# Patient Record
Sex: Male | Born: 1940 | Hispanic: No | Marital: Married | Smoking: Former smoker
Health system: Southern US, Community
[De-identification: ages and names within clinical notes are randomized; demographics above are authoritative.]

## PROBLEM LIST (undated history)

## (undated) DIAGNOSIS — R011 Cardiac murmur, unspecified: Secondary | ICD-10-CM

## (undated) DIAGNOSIS — I350 Nonrheumatic aortic (valve) stenosis: Secondary | ICD-10-CM

## (undated) HISTORY — DX: Cardiac murmur, unspecified: R01.1

## (undated) HISTORY — PX: CATARACT EXTRACTION: SUR2

## (undated) HISTORY — DX: Nonrheumatic aortic (valve) stenosis: I35.0

---

## 2012-06-06 ENCOUNTER — Institutional Professional Consult (permissible substitution): Payer: Self-pay | Admitting: Cardiovascular Disease

## 2012-06-26 ENCOUNTER — Ambulatory Visit (INDEPENDENT_AMBULATORY_CARE_PROVIDER_SITE_OTHER): Payer: Medicare Other | Admitting: Cardiovascular Disease

## 2012-06-26 ENCOUNTER — Encounter: Payer: Self-pay | Admitting: Cardiovascular Disease

## 2012-06-26 VITALS — BP 137/86 | HR 59 | Ht 69.0 in | Wt 189.0 lb

## 2012-06-26 DIAGNOSIS — I359 Nonrheumatic aortic valve disorder, unspecified: Secondary | ICD-10-CM

## 2012-06-26 DIAGNOSIS — R011 Cardiac murmur, unspecified: Secondary | ICD-10-CM

## 2012-06-26 DIAGNOSIS — I350 Nonrheumatic aortic (valve) stenosis: Secondary | ICD-10-CM

## 2012-06-26 NOTE — Progress Notes (Signed)
Patient ID: Bill [name], male   DOB: September 18, 1941, 71 y.o.   MRN: [id number] Healthy 71 yo referred by [facility] medical for murmur.  Denies previous history.  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  No chest pain dyspnea palpitatoins, fever or syncope.  No history of rheumatic fever.  Good dentition.  No previous echo.  Originally from [place].  Been here 13 years.  Only surgery he has had is cataract surgery.    ROS: Denies fever, malais, weight loss, blurry vision, decreased visual acuity, cough, sputum, SOB, hemoptysis, pleuritic pain, palpitaitons, heartburn, abdominal pain, melena, lower extremity edema, claudication, or rash.  All other systems reviewed and negative   General: Affect appropriate Healthy:  appears stated age HEENT: normal Neck supple with no adenopathy JVP normal no bruits no thyromegaly Lungs clear with no wheezing and good diaphragmatic motion Heart:  S1/S2 midl AS murmur, no rub, gallop or click PMI normal Abdomen: benighn, BS positve, no tenderness, no AAA no bruit.  No HSM or HJR Distal pulses intact with no bruits No edema Neuro non-focal Skin warm and dry No muscular weakness  Medications Current Outpatient Prescriptions  Medication Sig Dispense Refill  . aspirin 81 MG tablet Take 81 mg by mouth every other day.      Marland Kitchen CALCIUM PO Take by mouth daily.      . fish oil-omega-3 fatty acids 1000 MG capsule Take 2 g by mouth every other day.      . Multiple Vitamins-Minerals (CENTRUM SILVER PO) Take by mouth every other day.      . b complex vitamins tablet Take 1 tablet by mouth every other day.         Allergies Review of patient's allergies indicates not on file.  Family History: Family History  Problem Relation Age of Onset  . Heart failure Father   . Hypertension      family history    Social History: History   Social History  . Marital Status: Married    Spouse Name: N/A    Number of Children: N/A  . Years of Education: N/A   Occupational History    . full time      [employer]   Social History Main Topics  . Smoking status: Former Games developer  . Smokeless tobacco: Not on file  . Alcohol Use: Yes     occasionally  . Drug Use: No  . Sexually Active: Not on file   Other Topics Concern  . Not on file   Social History Narrative  . No narrative on file    Electrocardiogram:  NSR rate 59  Possible LAE otherwise normal  Assessment and Plan

## 2012-06-26 NOTE — Patient Instructions (Signed)
Your physician recommends that you schedule a follow-up appointment in: YEAR WITH DR [facility] Your physician recommends that you continue on your current medications as directed. Please refer to the Current Medication list given to you today. Your physician has requested that you have an echocardiogram. Echocardiography is a painless test that uses sound waves to create images of your heart. It provides your doctor with information about the size and shape of your heart and how well your heart's chambers and valves are working. This procedure takes approximately one hour. There are no restrictions for this procedure. DX AS MURMUR

## 2012-06-26 NOTE — Assessment & Plan Note (Signed)
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  No need for SBE  Echo and F/U in a year

## 2012-06-27 ENCOUNTER — Ambulatory Visit (HOSPITAL_COMMUNITY): Payer: Medicare Other | Attending: Cardiology | Admitting: Radiology

## 2012-06-27 DIAGNOSIS — I350 Nonrheumatic aortic (valve) stenosis: Secondary | ICD-10-CM

## 2012-06-27 DIAGNOSIS — R011 Cardiac murmur, unspecified: Secondary | ICD-10-CM

## 2012-06-27 DIAGNOSIS — I359 Nonrheumatic aortic valve disorder, unspecified: Secondary | ICD-10-CM | POA: Insufficient documentation

## 2012-06-27 NOTE — Progress Notes (Signed)
Echocardiogram performed.  

## 2012-07-01 NOTE — Addendum Note (Signed)
Addended by: Micki Riley C on: 07/01/2012 01:47 PM   Modules accepted: Orders

## 2013-07-01 ENCOUNTER — Ambulatory Visit: Payer: Medicare Other | Admitting: Cardiovascular Disease

## 2013-08-06 ENCOUNTER — Encounter: Payer: Self-pay | Admitting: Cardiovascular Disease

## 2013-08-06 ENCOUNTER — Encounter: Payer: Self-pay | Admitting: *Deleted

## 2013-08-06 DIAGNOSIS — I350 Nonrheumatic aortic (valve) stenosis: Secondary | ICD-10-CM | POA: Insufficient documentation

## 2013-08-07 ENCOUNTER — Encounter: Payer: Self-pay | Admitting: Cardiovascular Disease

## 2013-08-07 ENCOUNTER — Ambulatory Visit (INDEPENDENT_AMBULATORY_CARE_PROVIDER_SITE_OTHER): Payer: Medicare Other | Admitting: Cardiovascular Disease

## 2013-08-07 VITALS — BP 120/82 | HR 68 | Wt 189.0 lb

## 2013-08-07 DIAGNOSIS — E119 Type 2 diabetes mellitus without complications: Secondary | ICD-10-CM

## 2013-08-07 DIAGNOSIS — I350 Nonrheumatic aortic (valve) stenosis: Secondary | ICD-10-CM

## 2013-08-07 DIAGNOSIS — I359 Nonrheumatic aortic valve disorder, unspecified: Secondary | ICD-10-CM

## 2013-08-07 DIAGNOSIS — I10 Essential (primary) hypertension: Secondary | ICD-10-CM

## 2013-08-07 NOTE — Assessment & Plan Note (Signed)
No symptoms  F/U echo for gradients and LV function

## 2013-08-07 NOTE — Assessment & Plan Note (Signed)
Discussed low carb diet.  Target hemoglobin A1c is 6.5 or less.  Continue current medications.  

## 2013-08-07 NOTE — Patient Instructions (Addendum)
Your physician wants you to follow-up in:   YEAR WITH DR Eden Emms ou will receive a reminder letter in the mail two months in advance. If you don't receive a letter, please call our office to schedule the follow-up appointment. Your physician recommends that you continue on your current medications as directed. Please refer to the Current Medication list given to you today. Your physician has requested that you have an echocardiogram. Echocardiography is a painless test that uses sound waves to create images of your heart. It provides your doctor with information about the size and shape of your heart and how well your heart's chambers and valves are working. This procedure takes approximately one hour. There are no restrictions for this procedure.

## 2013-08-07 NOTE — Assessment & Plan Note (Signed)
Well controlled.  Continue current medications and low sodium Dash type diet.   Continue ACE given DM

## 2013-08-07 NOTE — Progress Notes (Signed)
Patient ID: Bill [name], male   DOB: [birth year], 72 y.o.   MRN: [id number] Healthy 72 yo referred by [facility] medical for murmur. Denies previous history. 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 No chest pain dyspnea palpitatoins, fever or syncope. No history of rheumatic fever. Good dentition. No previous echo. Originally from [place]. Been here 13 years. Only surgery he has had is cataract surgery.   Since I last saw him being Rx for diabetes and HTN.  He loves to eat ice cream which is part of the problem  Echo done  06/27/12  Study Conclusions  - Left ventricle: The cavity size was normal. Wall thickness was increased in a pattern of mild LVH. Systolic function was normal. The estimated ejection fraction was in the range of 60% to 65%. Wall motion was normal; there were no regional wall motion abnormalities. Doppler parameters are consistent with abnormal left ventricular relaxation (grade 1 diastolic dysfunction). - Aortic valve: Valve mobility was restricted. There was mild stenosis. Mild regurgitation. Mean gradient: 14mm Hg (S). Peak gradient: 23mm Hg (S).  ROS: Denies fever, malais, weight loss, blurry vision, decreased visual acuity, cough, sputum, SOB, hemoptysis, pleuritic pain, palpitaitons, heartburn, abdominal pain, melena, lower extremity edema, claudication, or rash.  All other systems reviewed and negative  General: Affect appropriate Healthy:  appears stated age HEENT: normal Neck supple with no adenopathy JVP normal no bruits no thyromegaly Lungs clear with no wheezing and good diaphragmatic motion Heart:  S1/S2 preserved mild AS  murmur, no rub, gallop or click PMI normal Abdomen: benighn, BS positve, no tenderness, no AAA no bruit.  No HSM or HJR Distal pulses intact with no bruits No edema Neuro non-focal Skin warm and dry No muscular weakness   Current Outpatient Prescriptions  Medication Sig Dispense Refill  . aspirin 81 MG tablet Take 81 mg by mouth every other  day.      . b complex vitamins tablet Take 1 tablet by mouth every other day.       Marland Kitchen CALCIUM PO Take by mouth daily.      . fish oil-omega-3 fatty acids 1000 MG capsule Take 2 g by mouth every other day.      Marland Kitchen glimepiride (AMARYL) 1 MG tablet Take 1 tablet by mouth daily.      Marland Kitchen lisinopril (PRINIVIL,ZESTRIL) 5 MG tablet Take 1 tablet by mouth daily.      . Multiple Vitamins-Minerals (CENTRUM SILVER PO) Take by mouth every other day.      . triamcinolone cream (KENALOG) 0.1 % as directed.       No current facility-administered medications for this visit.    Allergies  Review of patient's allergies indicates not on file.  Electrocardiogram: SR rate 68 normal   Assessment and Plan

## 2013-08-21 ENCOUNTER — Other Ambulatory Visit (HOSPITAL_COMMUNITY): Payer: Self-pay | Admitting: Cardiovascular Disease

## 2013-08-21 ENCOUNTER — Ambulatory Visit (HOSPITAL_COMMUNITY): Payer: Medicare Other | Attending: Cardiology | Admitting: Radiology

## 2013-08-21 DIAGNOSIS — R011 Cardiac murmur, unspecified: Secondary | ICD-10-CM | POA: Insufficient documentation

## 2013-08-21 DIAGNOSIS — I10 Essential (primary) hypertension: Secondary | ICD-10-CM | POA: Insufficient documentation

## 2013-08-21 DIAGNOSIS — I359 Nonrheumatic aortic valve disorder, unspecified: Secondary | ICD-10-CM | POA: Insufficient documentation

## 2013-08-21 DIAGNOSIS — I350 Nonrheumatic aortic (valve) stenosis: Secondary | ICD-10-CM

## 2013-08-21 DIAGNOSIS — E119 Type 2 diabetes mellitus without complications: Secondary | ICD-10-CM | POA: Insufficient documentation

## 2013-08-21 NOTE — Progress Notes (Signed)
Echocardiogram performed.  

## 2013-08-29 ENCOUNTER — Telehealth: Payer: Self-pay | Admitting: Cardiovascular Disease

## 2013-08-29 NOTE — Telephone Encounter (Signed)
F/U   Pt calling back to speak with nurse.

## 2013-08-29 NOTE — Telephone Encounter (Signed)
LMTCB ./CY 

## 2013-08-29 NOTE — Telephone Encounter (Signed)
Follow up    Pt returned your call with results.

## 2013-09-01 NOTE — Telephone Encounter (Signed)
Pt aware of echo results ./cy 

## 2014-08-25 ENCOUNTER — Other Ambulatory Visit: Payer: Self-pay | Admitting: *Deleted

## 2014-08-25 ENCOUNTER — Ambulatory Visit (INDEPENDENT_AMBULATORY_CARE_PROVIDER_SITE_OTHER): Payer: Medicare Other | Admitting: Cardiovascular Disease

## 2014-08-25 VITALS — BP 130/84 | HR 54 | Ht 69.0 in | Wt 193.0 lb

## 2014-08-25 DIAGNOSIS — I10 Essential (primary) hypertension: Secondary | ICD-10-CM

## 2014-08-25 DIAGNOSIS — I350 Nonrheumatic aortic (valve) stenosis: Secondary | ICD-10-CM

## 2014-08-25 DIAGNOSIS — E119 Type 2 diabetes mellitus without complications: Secondary | ICD-10-CM

## 2014-08-25 NOTE — Assessment & Plan Note (Signed)
No change in murmur active with no symptoms echo in a year

## 2014-08-25 NOTE — Progress Notes (Signed)
Patient ID: Bill [name], male   DOB: 1940/12/18, 73 y.o.   MRN: [id number] Healthy 73 yo referred by [facility] for murmur. Denies previous history. Acitve running two [employer]. No chest pain dyspnea palpitatoins, fever or syncope. No history of rheumatic fever. Good dentition. No previous echo. Originally from [place]. Been here 13 years. Only surgery he has had is cataract surgery. Since I last saw him being Rx for diabetes and HTN. He loves to eat ice cream which is part of the problem  Echo done 10/14   Study Conclusions  - Left ventricle: The cavity size was normal. There was mild concentric hypertrophy. Systolic function was normal. The estimated ejection fraction was in the range of 60% to 65%. Wall motion was normal; there were no regional wall motion abnormalities. Doppler parameters are consistent with abnormal left ventricular relaxation (grade 1 diastolic dysfunction). - Aortic valve: Possibly bicuspid; severely thickened, moderately calcified leaflets. Valve mobility was restricted. There was moderate stenosis. Mild regurgitation. - Atrial septum: No defect or patent foramen ovale was identified.  Mean gradient 16 peak 29 mmHg  Exercising a lot with no dyspnea, chest pain or palpitations. Has two grand children in [place] that he enjoys a lot especially 73 yo girl No longer on BP meds per primary Dr [name] on [address]    ROS: Denies fever, malais, weight loss, blurry vision, decreased visual acuity, cough, sputum, SOB, hemoptysis, pleuritic pain, palpitaitons, heartburn, abdominal pain, melena, lower extremity edema, claudication, or rash.  All other systems reviewed and negative  General: Affect appropriate Healthy:  appears stated age HEENT: normal Neck supple with no adenopathy JVP normal no bruits no thyromegaly Lungs clear with no wheezing and good diaphragmatic motion Heart:  S1/S2 AS  murmur, no rub, gallop or click PMI normal Abdomen: benighn, BS positve,  no tenderness, no AAA no bruit.  No HSM or HJR Distal pulses intact with no bruits No edema Neuro non-focal Skin warm and dry No muscular weakness   Current Outpatient Prescriptions  Medication Sig Dispense Refill  . aspirin 81 MG tablet Take 81 mg by mouth every other day.      . b complex vitamins tablet Take 1 tablet by mouth every other day.       Marland Kitchen. CALCIUM PO Take by mouth daily.      . fish oil-omega-3 fatty acids 1000 MG capsule Take 2 g by mouth every other day.      Marland Kitchen. glimepiride (AMARYL) 1 MG tablet Take 1 tablet by mouth daily.      Marland Kitchen. lisinopril (PRINIVIL,ZESTRIL) 5 MG tablet Take 1 tablet by mouth daily.      . Multiple Vitamins-Minerals (CENTRUM SILVER PO) Take by mouth every other day.      . triamcinolone cream (KENALOG) 0.1 % as directed.       No current facility-administered medications for this visit.    Allergies  Review of patient's allergies indicates not on file.  Electrocardiogram: 9/14 SR rate 68 normal  Today SR rate 58 normal   Assessment and Plan

## 2014-08-25 NOTE — Assessment & Plan Note (Signed)
Discussed low carb diet.  Target hemoglobin A1c is 6.5 or less.  Continue current medications.  

## 2014-08-25 NOTE — Patient Instructions (Signed)
Your physician wants you to follow-up in: YEAR WITH DR NISHAN  ECHO SAME  DAY   You will receive a reminder letter in the mail two months in advance. If you don't receive a letter, please call our office to schedule the follow-up appointment. Your physician recommends that you continue on your current medications as directed. Please refer to the Current Medication list given to you today. 

## 2014-08-25 NOTE — Assessment & Plan Note (Signed)
Well controlled.  Continue current medications and low sodium Dash type diet.    

## 2015-09-28 ENCOUNTER — Other Ambulatory Visit: Payer: Self-pay | Admitting: Cardiovascular Disease

## 2015-09-28 DIAGNOSIS — I350 Nonrheumatic aortic (valve) stenosis: Secondary | ICD-10-CM

## 2015-09-28 NOTE — Progress Notes (Signed)
Patient ID: Bill [name], male   DOB: [birth year], 74 y.o.   MRN: [id number] Healthy 74 y.o.  [facility] for murmur. Denies previous history. Acitve running two [employer] Sub[employer]. No chest pain dyspnea palpitatoins, fever or syncope. No history of rheumatic fever. Good dentition. No previous echo. Originally from [place]. Been here 13 years. Only surgery he has had is cataract surgery. Since I last saw him being Rx for diabetes and HTN. He loves to eat ice cream which is part of the problem  Echo done 10/14   Study Conclusions  - Left ventricle: The cavity size was normal. There was mild concentric hypertrophy. Systolic function was normal. The estimated ejection fraction was in the range of 60% to 65%. Wall motion was normal; there were no regional wall motion abnormalities. Doppler parameters are consistent with abnormal left ventricular relaxation (grade 1 diastolic dysfunction). - Aortic valve: Possibly bicuspid; severely thickened, moderately calcified leaflets. Valve mobility was restricted. There was moderate stenosis. Mild regurgitation. - Atrial septum: No defect or patent foramen ovale was identified.  Mean gradient 16 peak 29 mmHg  Exercising a lot with no dyspnea, chest pain or palpitations. Has two grand children in [place] that he enjoys a lot especially 74 yo girl No longer on BP meds per primary Dr [name] on [address]    ROS: Denies fever, malais, weight loss, blurry vision, decreased visual acuity, cough, sputum, SOB, hemoptysis, pleuritic pain, palpitaitons, heartburn, abdominal pain, melena, lower extremity edema, claudication, or rash.  All other systems reviewed and negative  General: Affect appropriate Healthy:  appears stated age HEENT: normal Neck supple with no adenopathy JVP normal no bruits no thyromegaly Lungs clear with no wheezing and good diaphragmatic motion Heart:  S1/S2 AS  murmur, no rub, gallop or click PMI normal Abdomen: benighn, BS  positve, no tenderness, no AAA no bruit.  No HSM or HJR Distal pulses intact with no bruits No edema Neuro non-focal Skin warm and dry No muscular weakness   Current Outpatient Prescriptions  Medication Sig Dispense Refill  . aspirin 81 MG tablet Take 81 mg by mouth every other day.    . b complex vitamins tablet Take 1 tablet by mouth every other day.     Marland Kitchen. CALCIUM PO Take by mouth daily.    . fish oil-omega-3 fatty acids 1000 MG capsule Take 2 g by mouth every other day.    Marland Kitchen. glimepiride (AMARYL) 1 MG tablet Take 1 tablet by mouth daily.    . Multiple Vitamins-Minerals (CENTRUM SILVER PO) Take by mouth every other day.    . triamcinolone cream (KENALOG) 0.1 % as directed.     No current facility-administered medications for this visit.    Allergies  Review of patient's allergies indicates no known allergies.  Electrocardiogram: 9/14 SR rate 68 normal  Oct 2015 58 normal   09/29/15  NSR rate 61 normal ECG   Assessment and Plan Aortic Stenosis: Reviewed echo from today  Moderate LVH mild AS mean gradient 10 mmHg f/u echo in a year no need for SBE HTN: Well controlled.  Continue current medications and low sodium Dash type diet.   DM: Discussed low carb diet.  Target hemoglobin A1c is 6.5 or less.  Continue current medications.     Bill [name]

## 2015-09-29 ENCOUNTER — Ambulatory Visit (INDEPENDENT_AMBULATORY_CARE_PROVIDER_SITE_OTHER): Payer: Medicare Other | Admitting: Cardiovascular Disease

## 2015-09-29 ENCOUNTER — Other Ambulatory Visit: Payer: Self-pay

## 2015-09-29 ENCOUNTER — Encounter: Payer: Self-pay | Admitting: Cardiovascular Disease

## 2015-09-29 ENCOUNTER — Ambulatory Visit (HOSPITAL_COMMUNITY): Payer: Medicare Other | Attending: Cardiology

## 2015-09-29 VITALS — BP 120/76 | HR 61 | Ht 67.0 in | Wt 189.0 lb

## 2015-09-29 DIAGNOSIS — I10 Essential (primary) hypertension: Secondary | ICD-10-CM | POA: Insufficient documentation

## 2015-09-29 DIAGNOSIS — I517 Cardiomegaly: Secondary | ICD-10-CM | POA: Diagnosis not present

## 2015-09-29 DIAGNOSIS — I350 Nonrheumatic aortic (valve) stenosis: Secondary | ICD-10-CM

## 2015-09-29 DIAGNOSIS — I352 Nonrheumatic aortic (valve) stenosis with insufficiency: Secondary | ICD-10-CM | POA: Diagnosis not present

## 2015-09-29 DIAGNOSIS — E119 Type 2 diabetes mellitus without complications: Secondary | ICD-10-CM | POA: Diagnosis not present

## 2015-09-29 DIAGNOSIS — Z87891 Personal history of nicotine dependence: Secondary | ICD-10-CM | POA: Insufficient documentation

## 2015-09-29 NOTE — Patient Instructions (Addendum)
Medication Instructions:  Your physician recommends that you continue on your current medications as directed. Please refer to the Current Medication list given to you today.  Labwork: NONE  Testing/Procedures: Your physician has requested that you have an echocardiogram in 1 year. Echocardiography is a painless test that uses sound waves to create images of your heart. It provides your doctor with information about the size and shape of your heart and how well your heart's chambers and valves are working. This procedure takes approximately one hour. There are no restrictions for this procedure.  Follow-Up: Your physician wants you to follow-up in: 1 year with Dr. Eden EmmsNishan. You will receive a reminder letter in the mail two months in advance. If you don't receive a letter, please call our office to schedule the follow-up appointment.  Please give us a call when you get home to inform our office of your current medications.  Any Other Special Instructions Will Be Listed Below (If Applicable).     If you need a refill on your cardiac medications before your next appointment, please call your pharmacy.

## 2016-09-20 ENCOUNTER — Other Ambulatory Visit (HOSPITAL_COMMUNITY): Payer: Medicare Other

## 2016-10-18 ENCOUNTER — Other Ambulatory Visit: Payer: Self-pay

## 2016-10-18 ENCOUNTER — Ambulatory Visit (HOSPITAL_COMMUNITY): Payer: Medicare Other | Attending: Cardiology

## 2016-10-18 DIAGNOSIS — I350 Nonrheumatic aortic (valve) stenosis: Secondary | ICD-10-CM

## 2016-10-18 DIAGNOSIS — Q231 Congenital insufficiency of aortic valve: Secondary | ICD-10-CM | POA: Insufficient documentation

## 2016-10-18 DIAGNOSIS — I10 Essential (primary) hypertension: Secondary | ICD-10-CM | POA: Insufficient documentation

## 2016-10-20 ENCOUNTER — Telehealth: Payer: Self-pay

## 2016-10-20 NOTE — Telephone Encounter (Signed)
Called pt, no answer.  Will try again Monday

## 2016-10-24 ENCOUNTER — Telehealth: Payer: Self-pay | Admitting: *Deleted

## 2016-10-24 NOTE — Telephone Encounter (Signed)
Called patient with test results. No answer. Left message to call back.  

## 2016-10-24 NOTE — Telephone Encounter (Signed)
-----   Message from Wendall StadePeter C Nishan, MD sent at 10/18/2016  2:30 PM EST ----- Moderate AS stable aorta dilated at 4.2 cm needs CTA of chest to look at entire aorta in setting of bicuspid valve And r/o aneurysm

## 2016-10-27 ENCOUNTER — Ambulatory Visit: Payer: Medicare Other

## 2016-10-27 ENCOUNTER — Other Ambulatory Visit: Payer: Self-pay

## 2016-10-27 DIAGNOSIS — Z136 Encounter for screening for cardiovascular disorders: Secondary | ICD-10-CM

## 2016-11-01 ENCOUNTER — Other Ambulatory Visit: Payer: Self-pay

## 2016-11-01 ENCOUNTER — Other Ambulatory Visit: Payer: Medicare Other | Admitting: *Deleted

## 2016-11-01 DIAGNOSIS — Z01812 Encounter for preprocedural laboratory examination: Secondary | ICD-10-CM

## 2016-11-01 LAB — BASIC METABOLIC PANEL
BUN: 16 mg/dL (ref 7–25)
CHLORIDE: 100 mmol/L (ref 98–110)
CO2: 27 mmol/L (ref 20–31)
Calcium: 9.1 mg/dL (ref 8.6–10.3)
Creat: 1.3 mg/dL — ABNORMAL HIGH (ref 0.70–1.18)
GLUCOSE: 212 mg/dL — AB (ref 65–99)
POTASSIUM: 4.6 mmol/L (ref 3.5–5.3)
Sodium: 135 mmol/L (ref 135–146)

## 2016-11-02 ENCOUNTER — Ambulatory Visit (INDEPENDENT_AMBULATORY_CARE_PROVIDER_SITE_OTHER)
Admission: RE | Admit: 2016-11-02 | Discharge: 2016-11-02 | Disposition: A | Discharge status: Home / Self Care | Payer: Medicare Other | Source: Ambulatory Visit | Attending: Cardiovascular Disease | Admitting: Cardiovascular Disease

## 2016-11-02 DIAGNOSIS — Z136 Encounter for screening for cardiovascular disorders: Secondary | ICD-10-CM

## 2016-11-02 MED ORDER — IOPAMIDOL (ISOVUE-370) INJECTION 76%
100.0000 mL | Freq: Once | INTRAVENOUS | Status: AC | PRN
  Administered 2016-11-02: 100 mL via INTRAVENOUS

## 2016-11-07 ENCOUNTER — Telehealth: Payer: Self-pay | Admitting: Cardiovascular Disease

## 2016-11-07 NOTE — Telephone Encounter (Signed)
Follow Up:    Pt would like his CT results from 11-02-16. He says he would like a copy of it also please.

## 2016-11-07 NOTE — Telephone Encounter (Signed)
Read to pt results of CT,copied pcp,mailed results to pt

## 2017-02-20 NOTE — Progress Notes (Signed)
Patient ID: Bill [name], male   DOB: Jun 30, 1941, 76 y.o.   MRN: [id number] Healthy 76 y.o. initially  referred by [facility] 2014  for murmur.  Diagnosed with aortic stenosis Denies previous history. 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 No chest pain dyspnea palpitatoins, fever or syncope. No history of rheumatic fever. Good dentition.  Originally from [place]. Been here 15 years. Only surgery he has had is cataract surgery.  Since I last saw him being Rx for diabetes and HTN. He loves to eat ice cream which is part of the problem  Echo done 10/14 with normal EF moderate AS mild AR mean gradient 16 mmHg peak 29 mmHg Echo done 10/18/16 with normal EF moderate AS mean gradient 17 mmHg and peak 34 mmHg  CTA: 11/02/16 personally reviewed:  Ascending aortic root 4.1 cm    Enjoys seeing grandchildren in [place] 70 yo girl very talented at gymnastics    ROS: Denies fever, malais, weight loss, blurry vision, decreased visual acuity, cough, sputum, SOB, hemoptysis, pleuritic pain, palpitaitons, heartburn, abdominal pain, melena, lower extremity edema, claudication, or rash.  All other systems reviewed and negative  General: Affect appropriate Healthy:  appears stated age HEENT: normal Neck supple with no adenopathy JVP normal no bruits no thyromegaly Lungs clear with no wheezing and good diaphragmatic motion Heart:  S1/S2 AS  murmur, no rub, gallop or click PMI normal Abdomen: benighn, BS positve, no tenderness, no AAA no bruit.  No HSM or HJR Distal pulses intact with no bruits No edema Neuro non-focal Skin warm and dry No muscular weakness   Current Outpatient Prescriptions  Medication Sig Dispense Refill  . glimepiride (AMARYL) 1 MG tablet Take 1 tablet by mouth daily as needed (Take as directed for low blood sugar).     . JENTADUETO 2.03-999 MG TABS Take 1 tablet by mouth daily.    Marland Kitchen lisinopril (PRINIVIL,ZESTRIL) 5 MG tablet Take 1 tablet by mouth daily.    . pravastatin (PRAVACHOL) 10 MG tablet  Take 1 tablet by mouth daily.     No current facility-administered medications for this visit.     Allergies  Patient has no known allergies.  Electrocardiogram: 9/14 SR rate 68 normal  Oct 2015 58 normal   09/29/15  NSR rate 61 normal ECG 02/21/17  NSR rate 83 normal ECG   Assessment and Plan Aortic Stenosis: no change in murmur needs f/u echo  November of this year no SBE required  HTN: Well controlled.  Continue current medications and low sodium Dash type diet.   DM: Discussed low carb diet.  Target hemoglobin A1c is 6.5 or less.  Continue current medications. 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

## 2017-02-21 ENCOUNTER — Ambulatory Visit (INDEPENDENT_AMBULATORY_CARE_PROVIDER_SITE_OTHER): Payer: Medicare Other | Admitting: Cardiovascular Disease

## 2017-02-21 ENCOUNTER — Encounter (INDEPENDENT_AMBULATORY_CARE_PROVIDER_SITE_OTHER): Payer: Self-pay

## 2017-02-21 ENCOUNTER — Encounter: Payer: Self-pay | Admitting: Cardiovascular Disease

## 2017-02-21 VITALS — BP 126/80 | HR 82 | Ht 68.0 in | Wt 191.8 lb

## 2017-02-21 DIAGNOSIS — R011 Cardiac murmur, unspecified: Secondary | ICD-10-CM | POA: Diagnosis not present

## 2017-02-21 DIAGNOSIS — I350 Nonrheumatic aortic (valve) stenosis: Secondary | ICD-10-CM | POA: Diagnosis not present

## 2017-02-21 DIAGNOSIS — I10 Essential (primary) hypertension: Secondary | ICD-10-CM

## 2017-02-21 NOTE — Patient Instructions (Addendum)
Medication Instructions:  Your physician recommends that you continue on your current medications as directed. Please refer to the Current Medication list given to you today.  Labwork: NONE  Testing/Procedures: Your physician has requested that you have an echocardiogram in November. Echocardiography is a painless test that uses sound waves to create images of your heart. It provides your doctor with information about the size and shape of your heart and how well your heart's chambers and valves are working. This procedure takes approximately one hour. There are no restrictions for this procedure.  Follow-Up: Your physician wants you to follow-up in: November with Dr. Eden Emms. You will receive a reminder letter in the mail two months in advance. If you don't receive a letter, please call our office to schedule the follow-up appointment.   If you need a refill on your cardiac medications before your next appointment, please call your pharmacy.

## 2017-10-15 ENCOUNTER — Ambulatory Visit (HOSPITAL_COMMUNITY): Payer: Medicare Other | Attending: Cardiovascular Disease

## 2017-12-04 ENCOUNTER — Encounter: Payer: Self-pay | Admitting: Cardiovascular Disease

## 2017-12-17 NOTE — Progress Notes (Signed)
Patient ID: Bill [name], male   DOB: [birth year], 77 y.o.   MRN: [id number] Healthy 77 y.o. initially  [facility]  for [facility].  Diagnosed with aortic stenosis Denies previous history. Acitve running two [employer]. No chest pain dyspnea palpitatoins, fever or syncope. No history of rheumatic fever. Good dentition.  Originally from [place]. Been here 17 years. Only surgery he has had is cataract surgery.  Since I last saw him being Rx for diabetes and HTN. He loves to eat ice cream which is part of the problem   Echo done 10/14 with normal EF moderate AS mild AR mean gradient 16 mmHg peak 29 mmHg Echo done 10/18/16 with normal EF moderate AS mean gradient 17 mmHg and peak 34 mmHg Preliminary review of echo today stable moderate AS mean gradient 18 mmHg  CTA: 11/02/16 personally reviewed:  Ascending aortic root 3.9 cm    Enjoys seeing grandchildren in [place] 77 yo girl very talented at gymnastics    ROS: Denies fever, malais, weight loss, blurry vision, decreased visual acuity, cough, sputum, SOB, hemoptysis, pleuritic pain, palpitaitons, heartburn, abdominal pain, melena, lower extremity edema, claudication, or rash.  All other systems reviewed and negative  General: BP (!) 132/92   Pulse 78   Ht 5\' 8"  (1.727 m)   Wt 175 lb (79.4 kg)   SpO2 99%   BMI 26.61 kg/m   Affect appropriate Healthy:  appears stated age HEENT: normal Neck supple with no adenopathy JVP normal no bruits no thyromegaly Lungs clear with no wheezing and good diaphragmatic motion Heart:  S1/S2 AS  murmur, no rub, gallop or click PMI normal Abdomen: benighn, BS positve, no tenderness, no AAA no bruit.  No HSM or HJR Distal pulses intact with no bruits No edema Neuro non-focal Skin warm and dry No muscular weakness   Current Outpatient Medications  Medication Sig Dispense Refill  . JENTADUETO 2.03-999 MG TABS Take 1 tablet by mouth daily.    Marland Kitchen. lisinopril (PRINIVIL,ZESTRIL) 5 MG tablet Take 1 tablet  by mouth daily.    . pravastatin (PRAVACHOL) 10 MG tablet Take 1 tablet by mouth daily.     No current facility-administered medications for this visit.     Allergies  Patient has no known allergies.  Electrocardiogram: 9/14 SR rate 68 normal  Oct 2015 58 normal   09/29/15  NSR rate 61 normal ECG 02/21/17  NSR rate 83 normal ECG   Assessment and Plan Aortic Stenosis: no change in murmur  Moderate AS mean gradient 18 mmHg  HTN: Well controlled.  Continue current medications and low sodium Dash type diet.   DM: Discussed low carb diet.  Target hemoglobin A1c is 6.5 or less.  Continue current medications. Aneurysm:  Aortic root only 3.9 cm on CT 11/02/16 should be able to screen with echo for her AS at same time     Charlton HawsPeter Nishan

## 2017-12-19 ENCOUNTER — Encounter: Payer: Self-pay | Admitting: Cardiovascular Disease

## 2017-12-19 ENCOUNTER — Other Ambulatory Visit: Payer: Self-pay

## 2017-12-19 ENCOUNTER — Ambulatory Visit (HOSPITAL_COMMUNITY): Payer: Medicare Other | Attending: Internal Medicine

## 2017-12-19 ENCOUNTER — Ambulatory Visit (INDEPENDENT_AMBULATORY_CARE_PROVIDER_SITE_OTHER): Payer: Medicare Other | Admitting: Cardiovascular Disease

## 2017-12-19 VITALS — BP 132/92 | HR 78 | Ht 68.0 in | Wt 175.0 lb

## 2017-12-19 DIAGNOSIS — I10 Essential (primary) hypertension: Secondary | ICD-10-CM

## 2017-12-19 DIAGNOSIS — I352 Nonrheumatic aortic (valve) stenosis with insufficiency: Secondary | ICD-10-CM | POA: Diagnosis not present

## 2017-12-19 DIAGNOSIS — E119 Type 2 diabetes mellitus without complications: Secondary | ICD-10-CM | POA: Insufficient documentation

## 2017-12-19 DIAGNOSIS — Z87891 Personal history of nicotine dependence: Secondary | ICD-10-CM | POA: Diagnosis not present

## 2017-12-19 DIAGNOSIS — I350 Nonrheumatic aortic (valve) stenosis: Secondary | ICD-10-CM | POA: Diagnosis not present

## 2017-12-19 DIAGNOSIS — R011 Cardiac murmur, unspecified: Secondary | ICD-10-CM

## 2017-12-19 DIAGNOSIS — I119 Hypertensive heart disease without heart failure: Secondary | ICD-10-CM | POA: Insufficient documentation

## 2017-12-19 DIAGNOSIS — I77810 Thoracic aortic ectasia: Secondary | ICD-10-CM | POA: Insufficient documentation

## 2017-12-19 NOTE — Patient Instructions (Addendum)

## 2018-01-09 ENCOUNTER — Encounter (INDEPENDENT_AMBULATORY_CARE_PROVIDER_SITE_OTHER): Payer: Self-pay | Admitting: Physical Medicine and Rehabilitation

## 2018-01-09 ENCOUNTER — Ambulatory Visit (INDEPENDENT_AMBULATORY_CARE_PROVIDER_SITE_OTHER): Payer: Medicare Other | Admitting: Physical Medicine and Rehabilitation

## 2018-01-09 ENCOUNTER — Ambulatory Visit (INDEPENDENT_AMBULATORY_CARE_PROVIDER_SITE_OTHER): Payer: Medicare Other

## 2018-01-09 VITALS — BP 149/92 | HR 87

## 2018-01-09 DIAGNOSIS — M9904 Segmental and somatic dysfunction of sacral region: Secondary | ICD-10-CM

## 2018-01-09 DIAGNOSIS — S300XXA Contusion of lower back and pelvis, initial encounter: Secondary | ICD-10-CM

## 2018-01-09 DIAGNOSIS — M545 Low back pain, unspecified: Secondary | ICD-10-CM

## 2018-01-09 MED ORDER — MELOXICAM 15 MG PO TABS: 15.0000 mg | ORAL_TABLET | Freq: Every day | ORAL | 0 refills | Status: DC

## 2018-01-09 NOTE — Progress Notes (Deleted)
Lower back pain. Has been seen for this in the ED for this in the past. No prior back surgery. Pain is in middle of lower back and does not radiate. No numbness or tingling. Symptoms for about 6-7 months. Slipped on stairs and fell on back- fell down 20 steps. Sitting makes pain.

## 2018-01-17 ENCOUNTER — Encounter (INDEPENDENT_AMBULATORY_CARE_PROVIDER_SITE_OTHER): Payer: Self-pay | Admitting: Physical Medicine and Rehabilitation

## 2018-01-17 NOTE — Progress Notes (Signed)
Bill [name] - 77 y.o. male MRN [id number]  Date of birth: 02-Oct-1941  Office Visit Note: Visit Date: 01/09/2018 PCP: Ralene Ok, MD Referred by: Ralene Ok, MD  Subjective: Chief Complaint  Patient presents with  . Lower Back - Pain   HPI: Bill [name] is a very pleasant 77 year old gentleman who is present with his wife and who was referred by his primary care physician Dr. Ludwig Clarks.  He comes in today with primarily a complaint of lower back and upper sacral pain.  He reports that last year 6-7 months ago he slipped on the stairs and fell backwards on his back side.  He did state at that point that he did fall down about 20 steps.  He had some pain at the time typically with his low back and is the area of contusion.  He treated this with mainly heat and ice and anti-inflammatory.  He did seek aid at the emergency department and was given some medication there as well.  He has not had any radicular pain or numbness or tingling.  He said at that time x-rays were unrevealing.  He has had no prior history of back surgery.  He has had no advanced imaging of his lumbar spine or sacral spine.  His biggest health issue is he does have aortic stenosis which has been followed recently.  He has a history of diabetes.  He is not on any anticoagulation.  He denies any focal weakness or bowel or bladder difficulty.  He has a lot of pain with sitting.  His biggest complaint is sitting for any length of time because of symptoms in the sacrum.  He has not had any specific physical therapy.    Review of Systems  Constitutional: Negative for chills, fever, malaise/fatigue and weight loss.  HENT: Negative for hearing loss and sinus pain.   Eyes: Negative for blurred vision, double vision and photophobia.  Respiratory: Negative for cough and shortness of breath.   Cardiovascular: Negative for chest pain, palpitations and leg swelling.  Gastrointestinal: Negative for abdominal pain, nausea and vomiting.    Genitourinary: Negative for flank pain.  Musculoskeletal: Positive for back pain. Negative for myalgias.  Skin: Negative for itching and rash.  Neurological: Negative for tremors, focal weakness and weakness.  Endo/Heme/Allergies: Negative.   Psychiatric/Behavioral: Negative for depression.  All other systems reviewed and are negative.  Otherwise per HPI.  Assessment & Plan: Visit Diagnoses:  1. Midline low back pain without sciatica, unspecified chronicity   2. Segmental and somatic dysfunction of sacral region   3. Contusion of coccyx, initial encounter     Plan: Findings:  Chronic 6-8 months of upper sacral pain and lumbo-sacral pain.  This is in the setting of a fall onto his backside with really contusion to the sacrum and coccyx.  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  It did take some convincing to show him on the model of the spine and where he points to the tumor not necessarily related.  Obviously we cannot rule out that that is a source of referred pain to the lumbosacral junction.  We also cannot rule out small disc herniation or even annular tear at the L5-S1 level which could cause the midline low back pain worse with sitting.  At this point without any red flag symptoms and the patient doing well on exam I think the best approach  is a good course of anti-inflammatory medicine and physical therapy just to see how much relief we get from a standpoint of looking at it as a mechanical problem of the lower back.  I will see him back in a few weeks.  If he is not getting much better we can decide at that point to look at MRI of the pelvis versus potential injection of either the coccyx area or epidural injection.   I did curbside Dr. Otelia SergeantNitka in our office for his opinion on the x-ray of the coccyx and he did see disc see the small angulation at that C1-2.  Unfortunately from a surgical standpoint  other than removing the tip of the coccyx there is really no treatment for that from a fracture standpoint if that is the source of pain.  Those typically heal at some point.  All the patient's questions were answered and we will see him back depending on how he does with anti-inflammatory and physical therapy.    Meds & Orders:  Meds ordered this encounter  Medications  . meloxicam (MOBIC) 15 MG tablet    Sig: Take 1 tablet (15 mg total) by mouth daily. Take with food    Dispense:  30 tablet    Refill:  0    Orders Placed This Encounter  Procedures  . XR Lumbar Spine Complete  . Ambulatory referral to Physical Therapy    Follow-up: Return in about 4 weeks (around 02/06/2018).   Procedures: No procedures performed  No notes on file   Clinical History: No specialty comments available.  He reports that he has quit smoking. His smokeless tobacco use includes chew. No results for input(s): HGBA1C, LABURIC in the last 8760 hours.  Objective:  VS:  HT:    WT:   BMI:     BP:(!) 149/92  HR:87bpm  TEMP: ( )  RESP:  Physical Exam  Constitutional: He is oriented to person, place, and time. He appears well-developed and well-nourished. No distress.  HENT:  Head: Normocephalic and atraumatic.  Nose: Nose normal.  Mouth/Throat: Oropharynx is clear and moist.  Eyes: Conjunctivae are normal. Pupils are equal, round, and reactive to light.  Neck: Normal range of motion. Neck supple. No tracheal deviation present.  Cardiovascular: Regular rhythm and intact distal pulses.  Pulmonary/Chest: Effort normal and breath sounds normal.  Abdominal: Soft. He exhibits no distension. There is no rebound and no guarding.  Musculoskeletal: He exhibits no deformity.  The patient ambulates without aid with a normal gait.  They have no pain with hip internal or external rotation.  There is mild pain with extension rotation of the lumbar spine. They have no pain over the greater trochanters or PSIS.  There  is some pain with rocking over the lumbosacral junction.  No pain directly at the coccyx.  On manual muscle testing there is 5/5 strength in all muscle groups of the lower extremities bilaterally without deficits.  There are 2+ muscle stretch reflexes at the quadriceps and gastrocnemius.  There is no clonus bilaterally.  Slump test is negative bilaterally.   Neurological: He is alert and oriented to person, place, and time. He exhibits normal muscle tone. Coordination normal.  Skin: Skin is warm. No rash noted.  Psychiatric: He has a normal mood and affect. His behavior is normal.  Nursing note and vitals reviewed.   Ortho Exam Imaging: No results found.  Past Medical/Family/Surgical/Social History: Medications & Allergies reviewed per EMR Patient Active Problem List   Diagnosis Date  Noted  . HTN (hypertension) 08/07/2013  . DM (diabetes mellitus) (HCC) 08/07/2013  . AS (aortic stenosis)   . Murmur 06/26/2012   Past Medical History:  Diagnosis Date  . AS (aortic stenosis)   . Murmur    Family History  Problem Relation Age of Onset  . Heart failure Father   . Hypertension Unknown        family history   Past Surgical History:  Procedure Laterality Date  . CATARACT EXTRACTION     Social History   Occupational History  . Occupation: full time     Comment: [employer]  Tobacco Use  . Smoking status: Former Games developer  . Smokeless tobacco: Current User    Types: Chew  . Tobacco comment: Occasionally chews tobacco  Substance and Sexual Activity  . Alcohol use: Yes    Comment: occasionally  . Drug use: No  . Sexual activity: Not on file

## 2018-01-18 ENCOUNTER — Other Ambulatory Visit: Payer: Self-pay

## 2018-01-18 DIAGNOSIS — I350 Nonrheumatic aortic (valve) stenosis: Secondary | ICD-10-CM

## 2018-01-18 NOTE — Progress Notes (Signed)
Notes recorded by Wendall StadeNishan, Peter C, MD on 12/19/2017 at 4:37 PM EST AS remains moderate really no change from 2017 good f/u echo in a year

## 2018-01-22 ENCOUNTER — Ambulatory Visit: Payer: Medicare Other | Attending: Physical Medicine and Rehabilitation | Admitting: Physical Therapy

## 2018-01-22 ENCOUNTER — Encounter: Payer: Self-pay | Admitting: Physical Therapy

## 2018-01-22 ENCOUNTER — Other Ambulatory Visit: Payer: Self-pay

## 2018-01-22 DIAGNOSIS — M545 Low back pain, unspecified: Secondary | ICD-10-CM

## 2018-01-22 DIAGNOSIS — M533 Sacrococcygeal disorders, not elsewhere classified: Secondary | ICD-10-CM

## 2018-01-22 DIAGNOSIS — R29898 Other symptoms and signs involving the musculoskeletal system: Secondary | ICD-10-CM | POA: Diagnosis present

## 2018-01-22 NOTE — Patient Instructions (Signed)
Hamstring Step 2   Left foot relaxed, knee straight, other leg bent, foot flat. Raise straight leg further upward to maximal range. Hold __30_ seconds. Relax leg completely down. Repeat _3__ times.  Piriformis Stretch   Lying on back, pull right knee toward opposite shoulder. Hold __30__ seconds. Repeat __3__ times.   Piriformis Stretch, Supine   Lie supine, one ankle crossed onto opposite knee. Holding bottom leg behind knee, gently pull legs toward chest until stretch is felt in buttock of top leg. Hold _30__ seconds. For deeper stretch gently push top knee away from body.  Repeat _3__ times per session.   Bridging   Slowly raise buttocks from floor, keeping stomach tight. Repeat _10-15_ times per set. Do __2__ sets per session.   Hip Extension: 2-4 Inches   Tighten gluteal muscle. Lift one leg _10-15__ times. Restabilize pelvis. Repeat with other leg. Keep pelvis still. Be sure pelvis does not rotate and back does not arch. Do __2_ sets  Mini Squat: Double Leg   With feet shoulder width apart, reach forward for balance and do a mini squat. Keep knees in line with second toe. Knees do not go past toes. Repeat _10-15__ times per set.

## 2018-01-22 NOTE — Therapy (Signed)
[facility] [address]  [address] [place], [place], [address] Phone: [phone]   Fax:  [phone]  Physical Therapy Evaluation  Patient Details  Name: Bill [name] MRN: [id number] Date of Birth: 1941/09/02 Referring Provider: Dr. Alvester Morin   Encounter Date: 01/22/2018  PT End of Session - 01/22/18 1206    Visit Number  1    Number of Visits  12    Date for PT Re-Evaluation  03/05/18    PT Start Time  1005    PT Stop Time  1050    PT Time Calculation (min)  45 min    Activity Tolerance  Patient tolerated treatment well    Behavior During Therapy  [facility] for tasks assessed/performed       Past Medical History:  Diagnosis Date  . AS (aortic stenosis)   . Murmur     Past Surgical History:  Procedure Laterality Date  . CATARACT EXTRACTION      There were no vitals filed for this visit.   Subjective Assessment - 01/22/18 1006    Subjective  reporting a fall in August 2018 - missed step on stairs and "tumbled" down - now having back and butt pain. Pain with prolonged sitting. Nothing else increased pain. Medicine is only thing that helps pain. Some reduced pain with seated + pillow under thighs. Some cracking at "top of butt" - no pattern with this. Denies N&T as well as bowel and baldder involvement.     Limitations  Sitting    How long can you sit comfortably?  1 hour    How long can you stand comfortably?  no limits    How long can you walk comfortably?  no limits    Diagnostic tests  xray - normal    Currently in Pain?  Yes    Pain Score  1     Pain Location  Coccyx and sacrum    Pain Orientation  Medial    Pain Descriptors / Indicators  Sharp    Pain Type  Chronic pain    Pain Onset  More than a month ago    Pain Frequency  Constant    Aggravating Factors   prolonged sitting    Pain Relieving Factors  pain medication         [facility] PT Assessment - 01/22/18 1002      Assessment   Medical Diagnosis   Midline low back pain without sciatica; segmental and somatic dysfunction of sacral region, contusion of coccyx    Referring Provider  Dr. Alvester Morin    Next MD Visit  -- 02/12/18    Prior Therapy  no      Precautions   Precautions  None      Restrictions   Weight Bearing Restrictions  No      Balance Screen   Has the patient fallen in the past 6 months  No August 2018 - down stairs    Has the patient had a decrease in activity level because of a fear of falling?   No    Is the patient reluctant to leave their home because of a fear of falling?   No      Home Public house manager residence    Living Arrangements  Spouse/significant other      Prior Function   Level of Independence  Independent    Vocation  Retired      IT consultant  Overall Cognitive Status  Within Functional Limits for tasks assessed      Observation/Other Assessments   Focus on Therapeutic Outcomes (FOTO)   Lumbar Spine: 83 (17% limited, predicted 22% limited)      Sensation   Light Touch  Appears Intact      Coordination   Gross Motor Movements are Fluid and Coordinated  Yes      Posture/Postural Control   Posture/Postural Control  Postural limitations    Postural Limitations  Rounded Shoulders;Forward head      ROM / Strength   AROM / PROM / Strength  AROM;Strength      AROM   Overall AROM   Within functional limits for tasks performed    Overall AROM Comments  painfree and symmetrical in all planes    AROM Assessment Site  Lumbar      Strength   Strength Assessment Site  Hip;Knee    Right/Left Hip  Right;Left    Right Hip Flexion  4-/5    Right Hip Extension  4-/5    Left Hip Flexion  4/5    Left Hip Extension  4-/5    Right/Left Knee  Right;Left    Right Knee Flexion  4+/5    Right Knee Extension  4+/5    Left Knee Flexion  4+/5    Left Knee Extension  4+/5      Flexibility   Soft Tissue Assessment /Muscle Length  yes    Hamstrings  B tightness    Quadriceps  B mild  tightness      Palpation   Palpation comment  slight tenderness with sacral and coccyx palpation, however, not to extent that sitting causes pain             Objective measurements completed on examination: See above findings.      [facility] Adult PT Treatment/Exercise - 01/22/18 1002      Exercises   Exercises  Lumbar      Lumbar Exercises: Stretches   Passive Hamstring Stretch  Right;Left;2 reps;30 seconds    Figure 4 Stretch  1 rep;30 seconds;With overpressure PT led      Lumbar Exercises: Standing   Functional Squats  10 reps;3 seconds      Lumbar Exercises: Supine   Bridge  10 reps;3 seconds      Lumbar Exercises: Prone   Other Prone Lumbar Exercises  B hip extension x 10 each LE             PT Education - 01/22/18 1206    Education provided  Yes    Education Details  exam findings, POC, HEP, donut pillow use    Person(s) Educated  Patient    Methods  Explanation;Demonstration;Handout    Comprehension  Verbalized understanding;Returned demonstration          PT Long Term Goals - 01/22/18 1213      PT LONG TERM GOAL #1   Title  patient to be independent with advanced HEP    Status  New    Target Date  03/05/18      PT LONG TERM GOAL #2   Title  patient to improve B LE strength to >/= 4+/5    Status  New    Target Date  03/05/18      PT LONG TERM GOAL #3   Title  patient to report ability to sit comfortably for >/= 1 hour    Status  New    Target Date  03/05/18  PT LONG TERM GOAL #4   Title  patient to report initiation and maitenance of weekly exercise/walking program    Status  New    Target Date  03/05/18             Plan - 01/22/18 1207    Clinical Impression Statement  Bill [name] is a pleasant 77 y/o male presenting to OPPT today regarding low back/coccyx pain after a fall in August 2018. Patient today with symmetrical and pain free active motion at lumbar spine, however, with noted tightness in B HS as well as proximal hip  weakness. Patient and wife educated on use of donut/coccyx pillow for hopeful reduced stress/weight on this area as sitting is his primary limited function. Patient given initial HEP for gentle stretching and sstrengthening with good carryover, but does require heavy VC/TC for appropriate muscle activation. Patient to benefit from PT to address pain and functional limitations.     Clinical Presentation  Stable    Clinical Decision Making  Low    Rehab Potential  Good    PT Frequency  2x / week    PT Duration  6 weeks    PT Treatment/Interventions  ADLs/Self Care Home Management;Cryotherapy;Electrical Stimulation;Moist Heat;Therapeutic exercise;Therapeutic activities;Functional mobility training;Ultrasound;Balance training;Neuromuscular re-education;Patient/family education;Manual techniques;Vasopneumatic Device;Taping;Dry needling;Passive range of motion    Consulted and Agree with Plan of Care  Patient       Patient will benefit from skilled therapeutic intervention in order to improve the following deficits and impairments:  Pain, Decreased strength, Decreased activity tolerance, Decreased mobility  Visit Diagnosis: Coccyx pain  Acute midline low back pain without sciatica  Other symptoms and signs involving the musculoskeletal system     Problem List Patient Active Problem List   Diagnosis Date Noted  . HTN (hypertension) 08/07/2013  . DM (diabetes mellitus) (HCC) 08/07/2013  . AS (aortic stenosis)   . Murmur 06/26/2012     Kipp Laurence, PT, DPT 01/22/18 12:16 PM   [facility] [address]  [address] [place], [place], [address] Phone: [phone]   Fax:  [phone]  Name: Bill [name] MRN: [id number] Date of Birth: 1940-12-30

## 2018-01-29 ENCOUNTER — Ambulatory Visit: Payer: Medicare Other | Admitting: Physical Therapy

## 2018-02-05 ENCOUNTER — Ambulatory Visit: Payer: Medicare Other | Admitting: Physical Therapy

## 2018-02-05 ENCOUNTER — Encounter: Payer: Self-pay | Admitting: Physical Therapy

## 2018-02-05 DIAGNOSIS — M545 Low back pain, unspecified: Secondary | ICD-10-CM

## 2018-02-05 DIAGNOSIS — M533 Sacrococcygeal disorders, not elsewhere classified: Secondary | ICD-10-CM

## 2018-02-05 DIAGNOSIS — R29898 Other symptoms and signs involving the musculoskeletal system: Secondary | ICD-10-CM

## 2018-02-05 NOTE — Patient Instructions (Signed)
Strengthening: Straight Leg Raise (Phase 1)   Tighten muscles on front of right thigh, then lift leg __~8__ inches from surface, keeping knee locked.  Repeat __15__ times per set. Do __2__ sets per session.   HIP: Abduction / External Rotation (Band)   Place band around knees. Lie on side with hips and knees bent. Raise top knee up, squeezing glutes. Keep feet together.  Hold _2-3__ seconds. Use ___red_____ band. __15_ reps per set

## 2018-02-05 NOTE — Therapy (Addendum)
[facility] [address]  [place], [place], [address] Phone: [phone]   Fax:  [phone]  Physical Therapy Treatment  Patient Details  Name: Bill [name] MRN: [id number] Date of Birth: [birth year] Referring Provider: Dr. Ernestina Patches   Encounter Date: 02/05/2018  PT End of Session - 02/05/18 1013    Visit Number  2    Number of Visits  12    Date for PT Re-Evaluation  03/05/18    PT Start Time  1010    PT Stop Time  1051    PT Time Calculation (min)  41 min    Activity Tolerance  Patient tolerated treatment well    Behavior During Therapy  [facility] for tasks assessed/performed       Past Medical History:  Diagnosis Date  . AS (aortic stenosis)   . Murmur     Past Surgical History:  Procedure Laterality Date  . CATARACT EXTRACTION      There were no vitals filed for this visit.  Subjective Assessment - 02/05/18 1012    Subjective  great improvements in pain - used donut for ~2 weeks; has not had to take pain medication for ~5 days    Patient Stated Goals  improve pain    Currently in Pain?  No/denies    Pain Score  0-No pain                      [facility] Adult PT Treatment/Exercise - 02/05/18 1014      Lumbar Exercises: Stretches   Passive Hamstring Stretch  Right;Left;3 reps;30 seconds supine with strap      Lumbar Exercises: Aerobic   Nustep  L5 x 6 min      Lumbar Exercises: Standing   Functional Squats  15 reps TRX    Wall Slides  15 reps;2 seconds VC to slow movements    Other Standing Lumbar Exercises  B side stepping x 25 feet - red tband at ankles      Lumbar Exercises: Supine   Bridge  15 reps;3 seconds red tband    Straight Leg Raise  15 reps bilateral - 2#    Other Supine Lumbar Exercises  HS bridge x 15 reps      Lumbar Exercises: Sidelying   Clam  Right;Left;15 reps 2 sets - red tband      Lumbar Exercises: Prone   Other Prone Lumbar Exercises  B hip extension x 15 each  LE                  PT Long Term Goals - 02/05/18 1014      PT LONG TERM GOAL #1   Title  patient to be independent with advanced HEP    Status  On-going      PT LONG TERM GOAL #2   Title  patient to improve B LE strength to >/= 4+/5    Status  On-going      PT LONG TERM GOAL #3   Title  patient to report ability to sit comfortably for >/= 1 hour    Status  On-going      PT LONG TERM GOAL #4   Title  patient to report initiation and maitenance of weekly exercise/walking program    Status  On-going            Plan - 02/05/18 1014    Clinical Impression Statement  Excellent improvements in pain and  mobility today. Reports use of donut pillow for ~ 2 weeks and no longer needing this. Patient tolerabe to all strengthening exercises today with no increase in pain or discomfort. Visible improvements in general flexibility as well. Will plan to follow-up next week with hopeful transition to HEP.     PT Treatment/Interventions  ADLs/Self Care Home Management;Cryotherapy;Electrical Stimulation;Moist Heat;Therapeutic exercise;Therapeutic activities;Functional mobility training;Ultrasound;Balance training;Neuromuscular re-education;Patient/family education;Manual techniques;Vasopneumatic Device;Taping;Dry needling;Passive range of motion    Consulted and Agree with Plan of Care  Patient       Patient will benefit from skilled therapeutic intervention in order to improve the following deficits and impairments:  Pain, Decreased strength, Decreased activity tolerance, Decreased mobility  Visit Diagnosis: Coccyx pain  Acute midline low back pain without sciatica  Other symptoms and signs involving the musculoskeletal system     Problem List Patient Active Problem List   Diagnosis Date Noted  . HTN (hypertension) 08/07/2013  . DM (diabetes mellitus) ([place]) 08/07/2013  . AS (aortic stenosis)   . Murmur 06/26/2012     Lanney Gins, PT, DPT 02/05/18 10:57  AM  PHYSICAL THERAPY DISCHARGE SUMMARY  Visits from Start of Care: 2  Current functional level related to goals / functional outcomes: See above - good progress in pain and general mobility - saw MD for follow-up was to return to PT prior to travelling, however, needed to cancel. D/C from PT as patient is past 30 day window to return. Will gladly see in the future for any other concerns   Remaining deficits: See above- unsure as patient did not return following last session   Education / Equipment: HEP  Plan: Patient agrees to discharge.  Patient goals were not met. Patient is being discharged due to not returning since the last visit.  ?????    Lanney Gins, PT, DPT 03/18/18 1:00 PM    [facility] [address]  [place], [place], [address] Phone: [phone]   Fax:  [phone]  Name: Bill [name] MRN: [id number] Date of Birth: Aug 11, 1941

## 2018-02-12 ENCOUNTER — Ambulatory Visit (INDEPENDENT_AMBULATORY_CARE_PROVIDER_SITE_OTHER): Payer: Medicare Other | Admitting: Physical Medicine and Rehabilitation

## 2018-02-12 ENCOUNTER — Encounter (INDEPENDENT_AMBULATORY_CARE_PROVIDER_SITE_OTHER): Payer: Self-pay | Admitting: Physical Medicine and Rehabilitation

## 2018-02-12 ENCOUNTER — Other Ambulatory Visit (INDEPENDENT_AMBULATORY_CARE_PROVIDER_SITE_OTHER): Payer: Self-pay | Admitting: Physical Medicine and Rehabilitation

## 2018-02-12 VITALS — BP 138/80 | HR 78 | Temp 97.8°F

## 2018-02-12 DIAGNOSIS — S300XXD Contusion of lower back and pelvis, subsequent encounter: Secondary | ICD-10-CM | POA: Diagnosis not present

## 2018-02-12 DIAGNOSIS — M545 Low back pain, unspecified: Secondary | ICD-10-CM

## 2018-02-12 DIAGNOSIS — M9904 Segmental and somatic dysfunction of sacral region: Secondary | ICD-10-CM

## 2018-02-12 MED ORDER — MELOXICAM 15 MG PO TABS: 15.0000 mg | ORAL_TABLET | Freq: Every day | ORAL | 2 refills | Status: DC | PRN

## 2018-02-12 NOTE — Progress Notes (Signed)
 .  Numeric Pain Rating Scale and Functional Assessment Average Pain 6 Pain Right Now 0 My pain is sharp Pain is worse with: sitting Pain improves with: therapy/exercise   In the last MONTH (on 0-10 scale) has pain interfered with the following?  1. General activity like being  able to carry out your everyday physical activities such as walking, climbing stairs, carrying groceries, or moving a chair?  Rating(3)  2. Relation with others like being able to carry out your usual social activities and roles such as  activities at home, at work and in your community. Rating(0)  3. Enjoyment of life such that you have  been bothered by emotional problems such as feeling anxious, depressed or irritable?  Rating(0)

## 2018-02-12 NOTE — Telephone Encounter (Signed)
Please advise 

## 2018-02-13 ENCOUNTER — Ambulatory Visit: Payer: Medicare Other | Admitting: Physical Therapy

## 2018-02-13 ENCOUNTER — Encounter (INDEPENDENT_AMBULATORY_CARE_PROVIDER_SITE_OTHER): Payer: Self-pay | Admitting: Physical Medicine and Rehabilitation

## 2018-02-13 NOTE — Progress Notes (Signed)
Bill [name] - 77 y.o. male MRN [id number]  Date of birth: 01-24-1941  Office Visit Note: Visit Date: 02/12/2018 PCP: Bill [name], Roy, MD Referred by: Bill [name], Roy, MD  Subjective: Chief Complaint  Patient presents with  . Lower Back - Pain   HPI: Bill [name] is a pleasant 77 year old gentleman who comes in today with his wife who provides some of the history.as we saw him approximately a month ago with worsening sacral pain and pain across the lumbosacral junction and somewhat of pain across the coccyx as well.  He had a fall onto his backside.  The details of this can be reviewed in the prior notes.  Since I have seen him he completed a 2-week course of meloxicam as well as started physical therapy and has been to 2 sessions.  According to the physical therapy notes his pain reduction is been fairly good.  According to the patient today he would agree with that and he is doing much better.  He still has some pain in the area with prolonged sitting or certain positions.  Overall is not really limiting what he can do.  He still would like for her to be better.  He is continuing physical therapy and is getting go 1 more time before he has a trip planned to [place] and possibly [place].  He does have a history of diabetes as well as hypertension and aortic stenosis.  He is only been taking the meloxicam randomly when needed at this point.  By way of review x-rays revealed step-off deformity of the coccyx which could have been traumatic at the time that he fell.  Otherwise was fairly normal-appearing other than some lower lumbar arthritis.  He has had no radicular leg pain or weakness.  He said no other changes or bowel or bladder symptoms.  Continues to have the low back pain.   Review of Systems  Constitutional: Negative for chills, fever, malaise/fatigue and weight loss.  HENT: Negative for hearing loss and sinus pain.   Eyes: Negative for blurred vision, double vision and photophobia.  Respiratory:  Negative for cough and shortness of breath.   Cardiovascular: Negative for chest pain, palpitations and leg swelling.  Gastrointestinal: Negative for abdominal pain, nausea and vomiting.  Genitourinary: Negative for flank pain.  Musculoskeletal: Positive for back pain. Negative for myalgias.  Skin: Negative for itching and rash.  Neurological: Negative for tremors, focal weakness and weakness.  Endo/Heme/Allergies: Negative.   Psychiatric/Behavioral: Negative for depression.  All other systems reviewed and are negative.  Otherwise per HPI.  Assessment & Plan: Visit Diagnoses:  1. Segmental and somatic dysfunction of sacral region   2. Coccyx contusion, subsequent encounter   3. Midline low back pain without sciatica, unspecified chronicity     Plan: Findings:  Lumbosacral sprain strain and coccydynia with x-ray evidence of step-off deformity of the coccyx.  He has been doing well with 2 sessions of physical therapy and a short course of meloxicam.  At this point I want him to continue with physical therapy to the point that they are both satisfied that he is doing well with exercises and core strengthening including pelvic type exercises.  In terms of the meloxicam I am going to write a refill but he needs to only take this on very rare occasions when it seems to be having a bad day.  With his medical history I would not place him on an anti-inflammatory for prolonged period of time.  He is going  to be going to [place] to see his granddaughter performing gymnastics and he says he may have a trip planned to [place].  I think having some meloxicam with him would be wise.  If he has a significant flareup that he could take this for a 2-week course daily.  We have talked to him and his wife about the necessity not to take this every day and he is not really doing that and does not desire to do that.  We will leave the follow-up open-ended at this point because of his travel plans.  If he worsens or  stagnates the next step may be MRI of his lumbar spine or pelvis.  We also may look at fluoroscopically guided injection of the coccyx.    Meds & Orders:  Meds ordered this encounter  Medications  . meloxicam (MOBIC) 15 MG tablet    Sig: Take 1 tablet (15 mg total) by mouth daily as needed for pain. Take with food    Dispense:  30 tablet    Refill:  2   No orders of the defined types were placed in this encounter.   Follow-up: Return if symptoms worsen or fail to improve.   Procedures: No procedures performed  No notes on file   Clinical History: No specialty comments available.   He reports that he has quit smoking. His smokeless tobacco use includes chew. No results for input(s): HGBA1C, LABURIC in the last 8760 hours.  Objective:  VS:  HT:    WT:   BMI:     BP:138/80  HR:78bpm  TEMP:97.8 F (36.6 C)(Oral)  RESP:100 % Physical Exam  Constitutional: He is oriented to person, place, and time. He appears well-developed and well-nourished. No distress.  HENT:  Head: Normocephalic and atraumatic.  Eyes: Pupils are equal, round, and reactive to light. Conjunctivae are normal.  Neck: Normal range of motion. Neck supple.  Cardiovascular: Regular rhythm and intact distal pulses.  Pulmonary/Chest: Effort normal. No respiratory distress.  Musculoskeletal:  Patient is seemingly sitting better today without discomfort.  He has some slowness when arising from a seated position to full extension.  He does have pain of his low back with extension but not his sacrum.  He has no pain over the greater trochanters.  He has no hip pain with rotation.  He has good distal strength.  Neurological: He is alert and oriented to person, place, and time. He exhibits normal muscle tone. Coordination normal.  Skin: Skin is warm and dry. No rash noted. No erythema.  Psychiatric: He has a normal mood and affect.  Nursing note and vitals reviewed.   Ortho Exam Imaging: No results found.  Past  Medical/Family/Surgical/Social History: Medications & Allergies reviewed per EMR, new medications updated. Patient Active Problem List   Diagnosis Date Noted  . HTN (hypertension) 08/07/2013  . DM (diabetes mellitus) (HCC) 08/07/2013  . AS (aortic stenosis)   . Murmur 06/26/2012   Past Medical History:  Diagnosis Date  . AS (aortic stenosis)   . Murmur    Family History  Problem Relation Age of Onset  . Heart failure Father   . Hypertension Unknown        family history   Past Surgical History:  Procedure Laterality Date  . CATARACT EXTRACTION     Social History   Occupational History  . Occupation: full time     Comment: [employer]  Tobacco Use  . Smoking status: Former Games developer  . Smokeless tobacco: Current User  Types: Chew  . Tobacco comment: Occasionally chews tobacco  Substance and Sexual Activity  . Alcohol use: Yes    Comment: occasionally  . Drug use: No  . Sexual activity: Not on file

## 2018-02-13 NOTE — Telephone Encounter (Signed)
Pharmacy requested 90-day supply but I do not want the patient had a 90-day supply because I really want him to take this very sparingly and we discussed this at his office visit.  He has medical conditions were would want him to take anti-inflammatories on a daily basis for any more than a couple of weeks at a time.

## 2018-11-07 NOTE — Progress Notes (Signed)
Patient ID: Christophe Louisramod K Mcmillion, male   DOB: 1941/09/30, 77 y.o.   MRN: [id number]     77 y.o. f/u for aortic stenosis, HTN and HLD and DM-2 .  Originally from [place].  Runs two [employer]. No chest pain or history of CAD.   CTA: 11/02/16 personally reviewed:  Ascending aortic root 3.9 cm   Last TTE  10/18/16 EF 60-65% moderate AS stasble mean gardient 17 peak 34 mmHg   Enjoys seeing grandchildren in [place] 77 yo girl very talented at gymnastics Trying To get into PACE  Retired [employer] more sedentary reading a lot of fiction More fatigue and some dyspnea TTE done today reviewed normal EF mean gradient 19 mmHg peak 38 mmHg   Going to [place] to see mother next month will be gone 3 weeks    ROS: Denies fever, malais, weight loss, blurry vision, decreased visual acuity, cough, sputum, SOB, hemoptysis, pleuritic pain, palpitaitons, heartburn, abdominal pain, melena, lower extremity edema, claudication, or rash.  All other systems reviewed and negative  General: BP 124/72   Pulse 68   Ht 5\' 8"  (1.727 m)   Wt 179 lb (81.2 kg)   BMI 27.22 kg/m   Affect appropriate Healthy:  appears stated age HEENT: normal Neck supple with no adenopathy JVP normal no bruits no thyromegaly Lungs clear with no wheezing and good diaphragmatic motion Heart:  S1/S2 AS  murmur, no rub, gallop or click PMI normal Abdomen: benighn, BS positve, no tenderness, no AAA no bruit.  No HSM or HJR Distal pulses intact with no bruits No edema Neuro non-focal Skin warm and dry No muscular weakness   Current Outpatient Medications  Medication Sig Dispense Refill  . JENTADUETO 2.03-999 MG TABS Take 1 tablet by mouth daily.    Marland Kitchen. lisinopril (PRINIVIL,ZESTRIL) 5 MG tablet Take 1 tablet by mouth daily.    . meloxicam (MOBIC) 15 MG tablet TAKE 1 TABLET BY MOUTH ONCE DAILY WITH FOOD AS NEEDED FOR PAIN 30 tablet 2  . ONETOUCH VERIO test strip     . pravastatin (PRAVACHOL) 10 MG tablet Take 1 tablet by mouth daily.      No current facility-administered medications for this visit.     Allergies  Patient has no known allergies.  Electrocardiogram: 11/08/18  SR rate 68 nonspecific ST changes   Assessment and Plan Aortic Stenosis: no change in murmur  Moderate mean gradient stable 19 mmHg 11/08/18  Stable f/u TTE in a year   HTN: Well controlled.  Continue current medications and low sodium Dash type diet.    DM: Discussed low carb diet.  Target hemoglobin A1c is 6.5 or less.  Continue current  medications.  Aneurysm:  Aortic root only 3.9 cm on CT 11/02/16 should be able to screen with echo for her AS at same time     Charlton HawsPeter Luverne Farone

## 2018-11-08 ENCOUNTER — Other Ambulatory Visit: Payer: Self-pay

## 2018-11-08 ENCOUNTER — Ambulatory Visit (INDEPENDENT_AMBULATORY_CARE_PROVIDER_SITE_OTHER): Payer: Medicare Other | Admitting: Cardiovascular Disease

## 2018-11-08 ENCOUNTER — Encounter: Payer: Self-pay | Admitting: Cardiovascular Disease

## 2018-11-08 ENCOUNTER — Ambulatory Visit (HOSPITAL_COMMUNITY): Payer: Medicare Other | Attending: Internal Medicine

## 2018-11-08 VITALS — BP 124/72 | HR 68 | Ht 68.0 in | Wt 179.0 lb

## 2018-11-08 DIAGNOSIS — R011 Cardiac murmur, unspecified: Secondary | ICD-10-CM

## 2018-11-08 DIAGNOSIS — I10 Essential (primary) hypertension: Secondary | ICD-10-CM | POA: Diagnosis not present

## 2018-11-08 DIAGNOSIS — I350 Nonrheumatic aortic (valve) stenosis: Secondary | ICD-10-CM | POA: Insufficient documentation

## 2018-11-08 NOTE — Patient Instructions (Addendum)
Medication Instructions:   If you need a refill on your cardiac medications before your next appointment, please call your pharmacy.   Lab work:  If you have labs (blood work) drawn today and your tests are completely normal, you will receive your results only by: . MyChart Message (if you have MyChart) OR . A paper copy in the mail If you have any lab test that is abnormal or we need to change your treatment, we will call you to review the results.  Testing/Procedures: Your physician has requested that you have an echocardiogram in one year. Echocardiography is a painless test that uses sound waves to create images of your heart. It provides your doctor with information about the size and shape of your heart and how well your heart's chambers and valves are working. This procedure takes approximately one hour. There are no restrictions for this procedure.  Follow-Up: At [facility], you and your health needs are our priority.  As part of our continuing mission to provide you with exceptional heart care, we have created designated Provider Care Teams.  These Care Teams include your primary Cardiologist (physician) and Advanced Practice Providers (APPs -  Physician Assistants and Nurse Practitioners) who all work together to provide you with the care you need, when you need it. You will need a follow up appointment in 12 months.  Please call our office 2 months in advance to schedule this appointment.  You may see Peter Nishan, MD or one of the following Advanced Practice Providers on your designated Care Team:   Lori Gerhardt, NP Laura Ingold, NP . Jill McDaniel, NP   

## 2018-11-11 ENCOUNTER — Ambulatory Visit: Payer: Medicare Other | Admitting: Cardiovascular Disease

## 2019-10-28 ENCOUNTER — Encounter (HOSPITAL_COMMUNITY): Payer: Self-pay | Admitting: Cardiovascular Disease

## 2019-10-28 ENCOUNTER — Telehealth: Payer: Self-pay

## 2019-10-28 NOTE — Telephone Encounter (Signed)
NOTES ON FILE FROM DR Jilda Panda [phone], REFERRAL SENT TO ECHO SCHEDULING

## 2019-10-29 ENCOUNTER — Other Ambulatory Visit (HOSPITAL_COMMUNITY): Payer: Self-pay

## 2019-10-29 DIAGNOSIS — I350 Nonrheumatic aortic (valve) stenosis: Secondary | ICD-10-CM

## 2019-11-24 ENCOUNTER — Ambulatory Visit (HOSPITAL_COMMUNITY): Payer: Medicare Other | Attending: Internal Medicine

## 2019-11-24 ENCOUNTER — Other Ambulatory Visit: Payer: Self-pay

## 2019-11-24 ENCOUNTER — Telehealth: Payer: Self-pay

## 2019-11-24 DIAGNOSIS — R011 Cardiac murmur, unspecified: Secondary | ICD-10-CM | POA: Diagnosis not present

## 2019-11-24 DIAGNOSIS — I350 Nonrheumatic aortic (valve) stenosis: Secondary | ICD-10-CM | POA: Insufficient documentation

## 2019-11-24 DIAGNOSIS — I10 Essential (primary) hypertension: Secondary | ICD-10-CM

## 2019-11-24 NOTE — Telephone Encounter (Signed)
-----   Message from Wendall Stade, MD sent at 11/24/2019 12:17 PM EST ----- AS moderate stable gradients since last year f/u echo in a year

## 2019-11-24 NOTE — Telephone Encounter (Signed)
Patient aware of results. Per Dr. Eden Emms, AS moderate stable gradients since last year f/u echo in a year. Patient verbalized understanding.

## 2019-12-01 NOTE — Progress Notes (Signed)
Patient ID: ERCELL [name], male   DOB: 1940/11/24, 79 y.o.   MRN: [id number]     79 y.o. f/u for aortic stenosis, HTN and HLD and DM-2 .  Originally from [place]. . No chest pain or history of CAD.   CTA: 11/02/16 personally reviewed:  Ascending aortic root 3.9 cm   Last TTE  11/24/19 stable mean gradient 20 mmhg peak 37 mmHg DVI 0.34   Enjoys seeing grandchildren in [place] 53 yo girl very talented at gymnastics Trying To get into PACE  Retired [employer] more sedentary reading a lot of fiction     Mother still lives in [place] he was stuck there for 8 months until August due to Covid  No cardiac symptoms  Discussed diagnosis of AS and prognosis    ROS: Denies fever, malais, weight loss, blurry vision, decreased visual acuity, cough, sputum, SOB, hemoptysis, pleuritic pain, palpitaitons, heartburn, abdominal pain, melena, lower extremity edema, claudication, or rash.  All other systems reviewed and negative  General: BP 132/80   Pulse 80   Ht 5\' 8"  (1.727 m)   Wt 172 lb 1.9 oz (78.1 kg)   SpO2 99%   BMI 26.17 kg/m   Affect appropriate Healthy:  appears stated age HEENT: normal Neck supple with no adenopathy JVP normal no bruits no thyromegaly Lungs clear with no wheezing and good diaphragmatic motion Heart:  S1/S2 AS  murmur, no rub, gallop or click PMI normal Abdomen: benighn, BS positve, no tenderness, no AAA no bruit.  No HSM or HJR Distal pulses intact with no bruits No edema Neuro non-focal Skin warm and dry No muscular weakness   Current Outpatient Medications  Medication Sig Dispense Refill  . JENTADUETO 2.03-999 MG TABS Take 1 tablet by mouth daily.    05-07-2006 lisinopril (PRINIVIL,ZESTRIL) 5 MG tablet Take 1 tablet by mouth daily.    Marland Kitchen VERIO test strip     . pravastatin (PRAVACHOL) 10 MG tablet Take 1 tablet by mouth daily.     No current facility-administered medications for this visit.    Allergies  Patient has no known allergies.  Electrocardiogram:  11/08/18  SR rate 68 nonspecific ST changes 1/20/221 SR rate 80 normal   Assessment and Plan Aortic Stenosis: no change in murmur  Moderate mean gradient stable 20 mmHg  11/24/19 f/u TTE in a year January 2022    HTN: Well controlled.  Continue current medications and low sodium Dash type diet.    DM: Discussed low carb diet.  Target hemoglobin A1c is 6.5 or less.  Continue current  medications.  Aneurysm:  Aortic root only 3.9 cm on CT 11/02/16 should be able to screen with echo for her AS at same time  Root only 3.8 cm on most recent echo     11/04/16

## 2019-12-10 ENCOUNTER — Ambulatory Visit (INDEPENDENT_AMBULATORY_CARE_PROVIDER_SITE_OTHER): Payer: Medicare Other | Admitting: Cardiovascular Disease

## 2019-12-10 ENCOUNTER — Other Ambulatory Visit: Payer: Self-pay

## 2019-12-10 ENCOUNTER — Encounter: Payer: Self-pay | Admitting: Cardiovascular Disease

## 2019-12-10 VITALS — BP 132/80 | HR 80 | Ht 68.0 in | Wt 172.1 lb

## 2019-12-10 DIAGNOSIS — I350 Nonrheumatic aortic (valve) stenosis: Secondary | ICD-10-CM

## 2019-12-10 NOTE — Patient Instructions (Signed)
Medication Instructions:   *If you need a refill on your cardiac medications before your next appointment, please call your pharmacy*  Lab Work:  If you have labs (blood work) drawn today and your tests are completely normal, you will receive your results only by: . MyChart Message (if you have MyChart) OR . A paper copy in the mail If you have any lab test that is abnormal or we need to change your treatment, we will call you to review the results.  Testing/Procedures: Your physician has requested that you have an echocardiogram. Echocardiography is a painless test that uses sound waves to create images of your heart. It provides your doctor with information about the size and shape of your heart and how well your heart's chambers and valves are working. This procedure takes approximately one hour. There are no restrictions for this procedure.  Follow-Up: At [facility], you and your health needs are our priority.  As part of our continuing mission to provide you with exceptional heart care, we have created designated Provider Care Teams.  These Care Teams include your primary Cardiologist (physician) and Advanced Practice Providers (APPs -  Physician Assistants and Nurse Practitioners) who all work together to provide you with the care you need, when you need it.  Your next appointment:   12 month(s)  The format for your next appointment:   In Person  Provider:   You may see Peter Nishan, MD or one of the following Advanced Practice Providers on your designated Care Team:    Lori Gerhardt, NP  Laura Ingold, NP  Jill McDaniel, NP   

## 2020-12-02 ENCOUNTER — Ambulatory Visit (HOSPITAL_COMMUNITY): Payer: Medicare Other | Attending: Cardiology

## 2020-12-02 ENCOUNTER — Other Ambulatory Visit: Payer: Self-pay

## 2020-12-02 DIAGNOSIS — I350 Nonrheumatic aortic (valve) stenosis: Secondary | ICD-10-CM | POA: Insufficient documentation

## 2020-12-02 LAB — ECHOCARDIOGRAM COMPLETE
AR max vel: 0.95 cm2
AV Area VTI: 0.95 cm2
AV Area mean vel: 0.89 cm2
AV Mean grad: 21 mmHg
AV Peak grad: 37.9 mmHg
Ao pk vel: 3.08 m/s
Area-P 1/2: 4.49 cm2
P 1/2 time: 331 msec
S' Lateral: 2.7 cm

## 2020-12-10 ENCOUNTER — Ambulatory Visit (INDEPENDENT_AMBULATORY_CARE_PROVIDER_SITE_OTHER): Payer: Medicare Other | Admitting: Cardiovascular Disease

## 2020-12-10 ENCOUNTER — Encounter: Payer: Self-pay | Admitting: Cardiovascular Disease

## 2020-12-10 ENCOUNTER — Other Ambulatory Visit: Payer: Self-pay

## 2020-12-10 VITALS — BP 134/90 | HR 71 | Ht 68.0 in | Wt 175.0 lb

## 2020-12-10 DIAGNOSIS — I350 Nonrheumatic aortic (valve) stenosis: Secondary | ICD-10-CM | POA: Diagnosis not present

## 2020-12-10 NOTE — Patient Instructions (Signed)
Medication Instructions:  Your physician recommends that you continue on your current medications as directed. Please refer to the Current Medication list given to you today.  *If you need a refill on your cardiac medications before your next appointment, please call your pharmacy*  Lab Work: None ordered today  Testing/Procedures: None ordered today  Follow-Up: At [facility], you and your health needs are our priority.  As part of our continuing mission to provide you with exceptional heart care, we have created designated Provider Care Teams.  These Care Teams include your primary Cardiologist (physician) and Advanced Practice Providers (APPs -  Physician Assistants and Nurse Practitioners) who all work together to provide you with the care you need, when you need it.  Your next appointment:   12 month(s)  The format for your next appointment:   In Person  Provider:   Charlton Haws, MD

## 2020-12-10 NOTE — Progress Notes (Signed)
Patient ID: Bill [name], male   DOB: [birth year], 80 y.o.   MRN: [id number]     80 y.o. f/u for aortic stenosis, HTN and HLD and DM-2 .  Originally from [place]. . No chest pain or history of CAD.   CTA: 11/02/16 personally reviewed:  Ascending aortic root 3.9 cm   Last TTE  11/24/19 stable mean gradient  21 mmHg peak 38 mmHG DVI 0.30   Enjoys seeing grandchildren in [place] 63 yo girl very talented at gymnastics Trying To get into PACE  Retired [employer] more sedentary reading a lot of fiction     Mother still lives in [place] he was stuck there for 8 months until August due to Covid  No cardiac symptoms  Discussed diagnosis of AS and prognosis  And TAVR  Wife concerned about personality changes and being more inpatient about little things No other signs of dementia    ROS: Denies fever, malais, weight loss, blurry vision, decreased visual acuity, cough, sputum, SOB, hemoptysis, pleuritic pain, palpitaitons, heartburn, abdominal pain, melena, lower extremity edema, claudication, or rash.  All other systems reviewed and negative  General: BP 134/90   Pulse 71   Ht 5\' 8"  (1.727 m)   Wt 79.4 kg   SpO2 98%   BMI 26.61 kg/m   Affect appropriate Healthy:  appears stated age HEENT: normal Neck supple with no adenopathy JVP normal no bruits no thyromegaly Lungs clear with no wheezing and good diaphragmatic motion Heart:  S1/S2 AS  murmur, no rub, gallop or click PMI normal Abdomen: benighn, BS positve, no tenderness, no AAA no bruit.  No HSM or HJR Distal pulses intact with no bruits No edema Neuro non-focal Skin warm and dry No muscular weakness   Current Outpatient Medications  Medication Sig Dispense Refill  . CREON 36000-114000 units CPEP capsule Take 72,000 Units by mouth 3 (three) times daily.    . JENTADUETO 2.03-999 MG TABS Take 1 tablet by mouth daily.    05-07-2006 lisinopril (PRINIVIL,ZESTRIL) 5 MG tablet Take 1 tablet by mouth daily.    Marland Kitchen VERIO test strip     .  pravastatin (PRAVACHOL) 10 MG tablet Take 1 tablet by mouth daily.     No current facility-administered medications for this visit.    Allergies  Patient has no known allergies.  Electrocardiogram: 11/08/18  SR rate 68 nonspecific ST changes 1/20/221 SR rate 80 normal 12/10/2020 NSR normal   Assessment and Plan Aortic Stenosis: no change in murmur  Moderate mean gradient stable 21 mmHg peak 38 mmHg on TTE  12/02/20 SBE prophylaxis    HTN: Well controlled.  Continue current medications and low sodium Dash type diet.    DM: Discussed low carb diet.  Target hemoglobin A1c is 6.5 or less.  Continue current  medications.  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

## 2021-06-21 ENCOUNTER — Other Ambulatory Visit: Payer: Self-pay | Admitting: Internal Medicine

## 2021-06-21 DIAGNOSIS — R6889 Other general symptoms and signs: Secondary | ICD-10-CM

## 2021-06-23 ENCOUNTER — Ambulatory Visit
Admission: RE | Admit: 2021-06-23 | Discharge: 2021-06-23 | Disposition: A | Discharge status: Home / Self Care | Payer: Medicare Other | Source: Ambulatory Visit | Attending: Internal Medicine | Admitting: Internal Medicine

## 2021-06-23 ENCOUNTER — Other Ambulatory Visit: Payer: Self-pay

## 2021-06-23 DIAGNOSIS — R6889 Other general symptoms and signs: Secondary | ICD-10-CM

## 2022-01-30 NOTE — Progress Notes (Incomplete)
Patient ID: Bill [name], male   DOB: [birth year], 81 y.o.   MRN: [id number]  ? ? ? ?81 y.o. f/u for aortic stenosis, HTN and HLD and DM-2 .  Originally from [place]. . No chest pain or history of CAD.  ? ?CTA: 11/02/16 personally reviewed:  Ascending aortic root 3.9 cm   ?Last TTE  12/02/20 EF normal mean gradient 21 peak 37.9 mmHg DVI 0.30 and AVA 0.9 cm2  ? ?Enjoys seeing grandchildren in [place] 38 yo girl very talented at gymnastics Trying ?To get into PACE ? 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 ?Discussed diagnosis of AS and prognosis  And TAVR ? ?Wife concerned about personality changes   ? ?*** ? ? ?ROS: Denies fever, malais, weight loss, blurry vision, decreased visual acuity, cough, sputum, SOB, hemoptysis, pleuritic pain, palpitaitons, heartburn, abdominal pain, melena, lower extremity edema, claudication, or rash.  All other systems reviewed and negative ? ?General: ?There were no vitals taken for this visit. ? ?Affect appropriate ?Healthy:  appears stated age ?HEENT: normal ?Neck supple with no adenopathy ?JVP normal no bruits no thyromegaly ?Lungs clear with no wheezing and good diaphragmatic motion ?Heart:  S1/S2 AS  murmur, no rub, gallop or click ?PMI normal ?Abdomen: benighn, BS positve, no tenderness, no AAA ?no bruit.  No HSM or HJR ?Distal pulses intact with no bruits ?No edema ?Neuro non-focal ?Skin warm and dry ?No muscular weakness ? ? ?Current Outpatient Medications  ?Medication Sig Dispense Refill  ? CREON 36000-114000 units CPEP capsule Take 72,000 Units by mouth 3 (three) times daily.    ? JENTADUETO 2.03-999 MG TABS Take 1 tablet by mouth daily.    ? lisinopril (PRINIVIL,ZESTRIL) 5 MG tablet Take 1 tablet by mouth daily.    ? ONETOUCH VERIO test strip     ? pravastatin (PRAVACHOL) 10 MG tablet Take 1 tablet by mouth daily.    ? ?No current facility-administered medications for this visit.  ? ? ?Allergies ? ?Patient has no known allergies. ? ?Electrocardiogram: 11/08/18   SR rate 68 nonspecific ST changes 1/20/221 SR rate 80 normal 01/30/2022 NSR normal  ? ?Assessment and Plan ? ?Aortic Stenosis: no change in murmur  Moderate mean gradient stable 21 mmHg peak 38 mmHg on TTE  12/02/20 SBE prophylaxis  ?  ?HTN: Well controlled.  Continue current medications and low sodium Dash type diet. ?   ?DM: Discussed low carb diet.  Target hemoglobin A1c is 6.5 or less.  Continue current ? medications. ? ?Aneurysm:  Aortic root only 3.9 cm on CT 11/02/16 4.2 by TTE 12/02/20 f/u CTA gated chest ordered  ? 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 ?Echo for AS ?Gated chest CTA for aneurysm  ?BMET  ? ?F/U in a year  ? ?Charlton Haws ? ?

## 2022-02-10 ENCOUNTER — Ambulatory Visit: Payer: Medicare Other | Admitting: Cardiovascular Disease

## 2022-04-03 NOTE — Progress Notes (Signed)
Patient ID: Bill [name], male   DOB: 1941-02-24, 81 y.o.   MRN: [id number]  ? ? ? ?81 y.o. f/u for aortic stenosis, HTN and HLD and DM-2 .  Originally from [place]. . No chest pain or history of CAD.  ? ?CTA: 11/02/16 personally reviewed:  Ascending aortic root 3.9 cm   ?TTE  11/24/19 stable mean gradient  21 mmHg peak 38 mmHG DVI 0.30 ?TTE 12/02/20  stable mean gradient 21 mmHg peak 37.9 mmhg and DVI 0.30  ? ?Enjoys seeing grandchildren in [place] 38 yo girl very talented at gymnastics Trying To get into [employer] ? 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  ?No cardiac symptoms ? ?Discussed diagnosis of AS and prognosis  And TAVR ? ?Wife concerned about personality changes and being more inpatient about little things No other signs of dementia  ? ?He seems very appropriate today Walks 2 miles/day no chest pain dyspnea or fatigue ?Only feels tired after he gets out of a hot bath ? ?His wife is a patient of Dr Tresa Endo seen at [facility] office  ? ? ?ROS: Denies fever, malais, weight loss, blurry vision, decreased visual acuity, cough, sputum, SOB, hemoptysis, pleuritic pain, palpitaitons, heartburn, abdominal pain, melena, lower extremity edema, claudication, or rash.  All other systems reviewed and negative ? ?General: ?BP 112/80   Pulse 84   Ht 5' 7.5" (1.715 m)   Wt 171 lb (77.6 kg)   SpO2 96%   BMI 26.39 kg/m?  ? ?Affect appropriate ?Healthy:  appears stated age ?HEENT: normal ?Neck supple with no adenopathy ?JVP normal no bruits no thyromegaly ?Lungs clear with no wheezing and good diaphragmatic motion ?Heart:  S1/S2 AS  murmur, no rub, gallop or click ?PMI normal ?Abdomen: benighn, BS positve, no tenderness, no AAA ?no bruit.  No HSM or HJR ?Distal pulses intact with no bruits ?No edema ?Neuro non-focal ?Skin warm and dry ?No muscular weakness ? ? ?Current Outpatient Medications  ?Medication Sig Dispense Refill  ? CREON 36000-114000 units CPEP capsule Take 72,000 Units by mouth 3 (three) times  daily.    ? FARXIGA 10 MG TABS tablet Take 10 mg by mouth every morning.    ? levothyroxine (SYNTHROID) 25 MCG tablet Take 25 mcg by mouth daily before breakfast.    ? lisinopril (PRINIVIL,ZESTRIL) 5 MG tablet Take 1 tablet by mouth daily.    ? ONETOUCH VERIO test strip     ? pravastatin (PRAVACHOL) 10 MG tablet Take 1 tablet by mouth daily.    ? TRADJENTA 5 MG TABS tablet Take 5 mg by mouth daily.    ? ?No current facility-administered medications for this visit.  ? ? ?Allergies ? ?Patient has no known allergies. ? ?Electrocardiogram: 04/05/2022 SR rate 84 normal  ? ?Assessment and Plan ?Aortic Stenosis: no change in murmur  Moderate mean gradient stable 21 mmHg peak 38 mmHg on TTE  12/02/20 SBE prophylaxis Update echo  ?  ?HTN: Well controlled.  Continue current medications and low sodium Dash type diet. ?   ?DM: Discussed low carb diet.  Target hemoglobin A1c is 6.5 or less.  Continue current ? medications. ? 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 ?TTE for AS  ? ?F/U in  a year  ? ?Charlton Haws ? ?

## 2022-04-05 ENCOUNTER — Encounter: Payer: Self-pay | Admitting: Cardiovascular Disease

## 2022-04-05 ENCOUNTER — Ambulatory Visit (INDEPENDENT_AMBULATORY_CARE_PROVIDER_SITE_OTHER): Payer: Medicare Other | Admitting: Cardiovascular Disease

## 2022-04-05 VITALS — BP 112/80 | HR 84 | Ht 67.5 in | Wt 171.0 lb

## 2022-04-05 DIAGNOSIS — I350 Nonrheumatic aortic (valve) stenosis: Secondary | ICD-10-CM | POA: Diagnosis not present

## 2022-04-05 DIAGNOSIS — I10 Essential (primary) hypertension: Secondary | ICD-10-CM

## 2022-04-05 NOTE — Patient Instructions (Signed)
Medication Instructions:  ?Your physician recommends that you continue on your current medications as directed. Please refer to the Current Medication list given to you today. ? ?*If you need a refill on your cardiac medications before your next appointment, please call your pharmacy* ? ?Lab Work: ?If you have labs (blood work) drawn today and your tests are completely normal, you will receive your results only by: ?MyChart Message (if you have MyChart) OR ?A paper copy in the mail ?If you have any lab test that is abnormal or we need to change your treatment, we will call you to review the results. ? ?Testing/Procedures: ?Your physician has requested that you have an echocardiogram. Echocardiography is a painless test that uses sound waves to create images of your heart. It provides your doctor with information about the size and shape of your heart and how well your heart?s chambers and valves are working. This procedure takes approximately one hour. There are no restrictions for this procedure. ? ?Follow-Up: ?At [facility], you and your health needs are our priority.  As part of our continuing mission to provide you with exceptional heart care, we have created designated Provider Care Teams.  These Care Teams include your primary Cardiologist (physician) and Advanced Practice Providers (APPs -  Physician Assistants and Nurse Practitioners) who all work together to provide you with the care you need, when you need it. ? ?We recommend signing up for the patient portal called "MyChart".  Sign up information is provided on this After Visit Summary.  MyChart is used to connect with patients for Virtual Visits (Telemedicine).  Patients are able to view lab/test results, encounter notes, upcoming appointments, etc.  Non-urgent messages can be sent to your provider as well.   ?To learn more about what you can do with MyChart, go to [email or web address].   ? ?Your next appointment:   ?1 year(s) ? ?The format for  your next appointment:   ?In Person ? ?Provider:   ?Peter Nishan, MD { ? ? ?Important Information About Sugar ? ? ? ? ?  ?

## 2022-04-28 ENCOUNTER — Ambulatory Visit (HOSPITAL_COMMUNITY): Payer: Medicare Other | Attending: Internal Medicine

## 2022-04-28 DIAGNOSIS — I350 Nonrheumatic aortic (valve) stenosis: Secondary | ICD-10-CM | POA: Insufficient documentation

## 2022-04-28 LAB — ECHOCARDIOGRAM COMPLETE
AR max vel: 1.22 cm2
AV Area VTI: 1.37 cm2
AV Area mean vel: 1.28 cm2
AV Mean grad: 25.5 mmHg
AV Peak grad: 47.2 mmHg
Ao pk vel: 3.44 m/s
Area-P 1/2: 2.99 cm2
P 1/2 time: 436 msec
S' Lateral: 2.9 cm

## 2022-05-01 ENCOUNTER — Telehealth: Payer: Self-pay

## 2022-05-01 DIAGNOSIS — I350 Nonrheumatic aortic (valve) stenosis: Secondary | ICD-10-CM

## 2022-05-01 NOTE — Telephone Encounter (Signed)
Placed order for echo, for patient to have done in one year.

## 2022-05-01 NOTE — Telephone Encounter (Signed)
-----   Message from Peter C Nishan, MD sent at 05/01/2022  8:04 AM EDT ----- Moderate AS f/u echo in a year 

## 2022-06-07 NOTE — Telephone Encounter (Deleted)
-----   Message from Wendall Stade, MD sent at 05/01/2022  8:04 AM EDT ----- Moderate AS f/u echo in a year

## 2022-06-07 NOTE — Telephone Encounter (Signed)
This encounter was created in error - please disregard.

## 2023-03-28 ENCOUNTER — Ambulatory Visit (HOSPITAL_COMMUNITY): Payer: Medicare HMO | Attending: Cardiovascular Disease

## 2023-03-28 DIAGNOSIS — I350 Nonrheumatic aortic (valve) stenosis: Secondary | ICD-10-CM

## 2023-03-28 LAB — ECHOCARDIOGRAM COMPLETE
AR max vel: 0.9 cm2
AV Area VTI: 0.92 cm2
AV Area mean vel: 0.93 cm2
AV Mean grad: 22.5 mmHg
AV Peak grad: 39.3 mmHg
Ao pk vel: 3.14 m/s
Area-P 1/2: 3.48 cm2
S' Lateral: 2.4 cm

## 2023-03-30 ENCOUNTER — Telehealth: Payer: Self-pay

## 2023-03-30 DIAGNOSIS — I350 Nonrheumatic aortic (valve) stenosis: Secondary | ICD-10-CM

## 2023-03-30 NOTE — Telephone Encounter (Signed)
-----   Message from Wendall Stade, MD sent at 03/28/2023  3:00 PM EDT ----- Mod to severe AS stable from prior TTE f/u echo in a year

## 2023-03-30 NOTE — Telephone Encounter (Signed)
The patient has been notified of the result and verbalized understanding.  All questions (if any) were answered. Ethelda Chick, RN 03/30/2023 11:58 AM   Placed order for echo in one year.

## 2024-02-11 ENCOUNTER — Telehealth (HOSPITAL_COMMUNITY): Payer: Self-pay | Admitting: Cardiovascular Disease

## 2024-02-11 NOTE — Telephone Encounter (Signed)
 Called to schedule echocardiogram due in may per Dr. Eden Emms. Patient has moved to [facility] and is following cardiologist there. Order will be removed from the echo WQ> Thank you.
